# Patient Record
Sex: Female | Born: 2000 | Race: Black or African American | Hispanic: No | Marital: Single
Health system: Midwestern US, Community
[De-identification: ages and names within clinical notes are randomized; demographics above are authoritative.]

## PROBLEM LIST (undated history)

## (undated) DIAGNOSIS — J45909 Unspecified asthma, uncomplicated: Secondary | ICD-10-CM

## (undated) DIAGNOSIS — R6251 Failure to thrive (child): Secondary | ICD-10-CM

## (undated) DIAGNOSIS — J069 Acute upper respiratory infection, unspecified: Secondary | ICD-10-CM

## (undated) HISTORY — PX: INGUINAL HERNIA REPAIR: SUR1180

## (undated) HISTORY — DX: Acute upper respiratory infection, unspecified: J06.9

## (undated) HISTORY — DX: Unspecified asthma, uncomplicated: J45.909

---
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
## 2022-03-21 ENCOUNTER — Emergency Department (HOSPITAL_COMMUNITY)
Admission: EM | Admit: 2022-03-21 | Discharge: 2022-03-21 | Disposition: A | Discharge status: Home / Self Care | Payer: Medicaid Other | Attending: Emergency Medicine | Admitting: Emergency Medicine

## 2022-03-21 ENCOUNTER — Other Ambulatory Visit: Payer: Self-pay

## 2022-03-21 ENCOUNTER — Emergency Department (HOSPITAL_COMMUNITY): Payer: Medicaid Other

## 2022-03-21 ENCOUNTER — Encounter (HOSPITAL_COMMUNITY): Payer: Self-pay

## 2022-03-21 DIAGNOSIS — Y92410 Unspecified street and highway as the place of occurrence of the external cause: Secondary | ICD-10-CM | POA: Insufficient documentation

## 2022-03-21 DIAGNOSIS — S7002XA Contusion of left hip, initial encounter: Secondary | ICD-10-CM | POA: Diagnosis not present

## 2022-03-21 DIAGNOSIS — S79912A Unspecified injury of left hip, initial encounter: Secondary | ICD-10-CM | POA: Diagnosis present

## 2022-03-21 LAB — I-STAT BETA HCG BLOOD, ED (MC, WL, AP ONLY): I-stat hCG, quantitative: <5 m[IU]/mL (ref <5)

## 2022-03-21 MED ORDER — CYCLOBENZAPRINE HCL 10 MG PO TABS: 10.0000 mg | ORAL_TABLET | Freq: Two times a day (BID) | ORAL | 0 refills | Status: DC | PRN

## 2022-03-21 NOTE — ED Notes (Signed)
Provided pt ice pack.
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
## 2022-03-21 NOTE — ED Notes (Signed)
Patient transported to X-ray 
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
## 2022-03-21 NOTE — Discharge Instructions (Signed)
Recommend Tylenol, ibuprofen for pain.  Flexeril is a muscle relaxant.  Please use safely as this medication is sedating.  No dangerous activities or driving. ?

## 2022-04-01 DIAGNOSIS — M25552 Pain in left hip: Secondary | ICD-10-CM | POA: Insufficient documentation

## 2022-04-09 DIAGNOSIS — M5416 Radiculopathy, lumbar region: Secondary | ICD-10-CM | POA: Insufficient documentation

## 2022-04-09 HISTORY — DX: Radiculopathy, lumbar region: M54.16

## 2022-07-24 DIAGNOSIS — M79605 Pain in left leg: Secondary | ICD-10-CM | POA: Insufficient documentation
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
## 2023-06-19 ENCOUNTER — Emergency Department (HOSPITAL_COMMUNITY)
Admission: EM | Admit: 2023-06-19 | Discharge: 2023-06-19 | Disposition: A | Discharge status: Home / Self Care | Payer: Medicaid Other | Attending: Emergency Medicine | Admitting: Emergency Medicine

## 2023-06-19 ENCOUNTER — Other Ambulatory Visit: Payer: Self-pay

## 2023-06-19 ENCOUNTER — Emergency Department (HOSPITAL_COMMUNITY): Payer: Medicaid Other

## 2023-06-19 DIAGNOSIS — R55 Syncope and collapse: Secondary | ICD-10-CM

## 2023-06-19 DIAGNOSIS — Z1152 Encounter for screening for COVID-19: Secondary | ICD-10-CM | POA: Insufficient documentation

## 2023-06-19 LAB — CBC WITH DIFFERENTIAL/PLATELET
Abs Immature Granulocytes: 0.01 10*3/uL (ref 0.00–0.07)
Basophils Absolute: 0 10*3/uL (ref 0.0–0.1)
Basophils Relative: 1 %
Eosinophils Absolute: 0 10*3/uL (ref 0.0–0.5)
Eosinophils Relative: 0 %
HCT: 37 % (ref 36.0–46.0)
Hemoglobin: 11.7 g/dL — ABNORMAL LOW (ref 12.0–15.0)
Immature Granulocytes: 0 %
Lymphocytes Relative: 51 %
Lymphs Abs: 2 10*3/uL (ref 0.7–4.0)
MCH: 27 pg (ref 26.0–34.0)
MCHC: 31.6 g/dL (ref 30.0–36.0)
MCV: 85.3 fL (ref 80.0–100.0)
Monocytes Absolute: 0.4 10*3/uL (ref 0.1–1.0)
Monocytes Relative: 11 %
Neutro Abs: 1.5 10*3/uL — ABNORMAL LOW (ref 1.7–7.7)
Neutrophils Relative %: 37 %
Platelets: 197 10*3/uL (ref 150–400)
RBC: 4.34 MIL/uL (ref 3.87–5.11)
RDW: 12.5 % (ref 11.5–15.5)
WBC: 3.9 10*3/uL — ABNORMAL LOW (ref 4.0–10.5)
nRBC: 0 % (ref 0.0–0.2)

## 2023-06-19 LAB — BASIC METABOLIC PANEL
Anion gap: 12 (ref 5–15)
BUN: 9 mg/dL (ref 6–20)
CO2: 21 mmol/L — ABNORMAL LOW (ref 22–32)
Calcium: 8.9 mg/dL (ref 8.9–10.3)
Chloride: 103 mmol/L (ref 98–111)
Creatinine, Ser: 0.64 mg/dL (ref 0.44–1.00)
GFR, Estimated: >60 mL/min (ref >60)
Glucose, Bld: 88 mg/dL (ref 70–99)
Potassium: 3.5 mmol/L (ref 3.5–5.1)
Sodium: 136 mmol/L (ref 135–145)

## 2023-06-19 LAB — D-DIMER, QUANTITATIVE: D-Dimer, Quant: 0.46 ug/mL-FEU (ref 0.00–0.50)

## 2023-06-19 LAB — HCG, QUANTITATIVE, PREGNANCY: hCG, Beta Chain, Quant, S: <1 m[IU]/mL (ref <5)

## 2023-06-19 LAB — CBG MONITORING, ED: Glucose-Capillary: 74 mg/dL (ref 70–99)

## 2023-06-19 NOTE — ED Provider Notes (Signed)
[place] EMERGENCY DEPARTMENT AT [facility] Provider Note   CSN: [id number] Arrival date & time: 06/19/23  1755     History  Chief Complaint  Patient presents with   Near Syncope    Caroline Castillo is a 22 y.o. female presenting to the ED with concern for lightheadedness.  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  She cannot recall whether she felt sick this morning earlier this week, saying "my memory is just generally bad".  However she began to feel lightheaded with tunnel vision while she was standing and cleaning and was advised to sit down.  She now feels back to normal.  She reports she did not eat or drink anything today.  She denies any chest pain or pressure.  She denies any prior history of syncope.    Her mother is present at the bedside and does report a history of DVT and/or PE in the mother, during pregnancy, and her mother says she (the mother) tested positive for protein S deficiency  HPI     Home Medications Prior to Admission medications   Medication Sig Start Date End Date Taking? Authorizing Provider  Azelastine HCl 0.15 % SOLN Place 2 sprays into both nostrils 2 (two) times daily as needed. 09/04/21   Marcelyn Bruins, MD  beclomethasone (QVAR) 80 MCG/ACT inhaler Inhale 2 puffs into the lungs daily.    [provider]  cyclobenzaprine (FLEXERIL) 10 MG tablet Take 1 tablet (10 mg total) by mouth 2 (two) times daily as needed for muscle spasms. 03/21/22   Curatolo, Adam, DO  fluticasone (FLONASE) 50 MCG/ACT nasal spray Place 2 sprays into both nostrils daily.    [provider]  levocetirizine (XYZAL) 5 MG tablet Take 1 tablet (5 mg total) by mouth every evening. 09/04/21   Marcelyn Bruins, MD  montelukast (SINGULAIR) 10 MG tablet Take 10 mg by mouth at bedtime.    [provider]  Olopatadine HCl 0.2 % SOLN Apply 1 drop to eye daily as needed.  09/04/21   Marcelyn Bruins, MD      Allergies    Patient has no known allergies.    Review of Systems   Review of Systems  Physical Exam Updated Vital Signs BP 106/76   Pulse 84   Temp 99 F (37.2 C)   Resp 16   Ht 5\' 5"  (1.651 m)   Wt 49.9 kg   SpO2 96%   BMI 18.30 kg/m  Physical Exam Constitutional:      General: She is not in acute distress. HENT:     Head: Normocephalic and atraumatic.  Eyes:     Conjunctiva/sclera: Conjunctivae normal.     Pupils: Pupils are equal, round, and reactive to light.  Cardiovascular:     Rate and Rhythm: Normal rate and regular rhythm.  Pulmonary:     Effort: Pulmonary effort is normal. No respiratory distress.     Breath sounds: Normal breath sounds.  Abdominal:     General: There is no distension.     Tenderness: There is no abdominal tenderness.  Skin:    General: Skin is warm and dry.  Neurological:     General: No focal deficit present.     Mental Status: She is alert. Mental status is at baseline.  Psychiatric:        Mood and Affect: Mood normal.        Behavior: Behavior  normal.     ED Results / Procedures / Treatments   Labs (all labs ordered are listed, but only abnormal results are displayed) Labs Reviewed  BASIC METABOLIC PANEL - Abnormal; Notable for the following components:      Result Value   CO2 21 (*)    All other components within normal limits  CBC WITH DIFFERENTIAL/PLATELET - Abnormal; Notable for the following components:   WBC 3.9 (*)    Hemoglobin 11.7 (*)    Neutro Abs 1.5 (*)    All other components within normal limits  SARS CORONAVIRUS 2 BY RT PCR  HCG, QUANTITATIVE, PREGNANCY  D-DIMER, QUANTITATIVE  CBG MONITORING, ED    EKG EKG Interpretation Date/Time:  Friday June 19 2023 17:53:14 EDT Ventricular Rate:  82 PR Interval:  162 QRS Duration:  92 QT Interval:  348 QTC Calculation: 406 R Axis:   99  Text Interpretation: Normal sinus rhythm Rightward axis Borderline ECG No  previous ECGs available Confirmed by Alvester Chou [phone]) on 06/19/2023 10:01:19 PM  Radiology DG Chest Portable 1 View  Result Date: 06/19/2023 CLINICAL DATA:  near syncope EXAM: PORTABLE CHEST 1 VIEW COMPARISON:  Chest x-ray 08/21/2020 FINDINGS: The heart and mediastinal contours are within normal limits. No focal consolidation. No pulmonary edema. No pleural effusion. No pneumothorax. No acute osseous abnormality. IMPRESSION: No active disease. Electronically Signed   By: Tish Frederickson M.D.   On: 06/19/2023 22:28    Procedures Procedures    Medications Ordered in ED Medications - No data to display  ED Course/ Medical Decision Making/ A&P                             Medical Decision Making Amount and/or Complexity of Data Reviewed Labs: ordered. Radiology: ordered.   This patient presents to the ED with concern for near syncope. This involves an extensive number of treatment options, and is a complaint that carries with it a high risk of complications and morbidity.  The differential diagnosis includes vasovagal episode versus arrhythmia versus anemia versus PE versus other  Co-morbidities that complicate the patient evaluation: Family history of coagulopathy at high risk of thrombus  Additional history obtained from patient's mother at bedside  I ordered and personally interpreted labs.  The pertinent results include: No emergent findings  I ordered imaging studies including dg chest I independently visualized and interpreted imaging which showed no emergent findings I agree with the radiologist interpretation  The patient was maintained on a cardiac monitor.  I personally viewed and interpreted the cardiac monitored which showed an underlying rhythm of: Sinus rhythm  Per my interpretation the patient's ECG shows sinus rhythm no acute ischemic findings  I have reviewed the patients home medicines and have made adjustments as needed  Test Considered: With a negative  D-dimer I have a lower suspicion for acute PE do not see that a CT angiogram is indicated  After the interventions noted above, I reevaluated the patient and found that they have: improved -she has been observed in the ER for period of 5 and half hours with no recurring symptoms, feeling back to baseline and well.    Dispostion:  After consideration of the diagnostic results and the patients response to treatment, I feel that the patent would benefit from outpatient PCP follow-up.  I discussed with the patient, as well as her mother at bedside, but this episode today may have been a vasovagal episode, may have  been triggered also by the fact that she did not eat anything all day.  I certainly encouraged her to eat regular meals during the daytime.  I encouraged her to follow-up with her PCP.  Return precautions were discussed.  I advised rest over the weekend.         Final Clinical Impression(s) / ED Diagnoses Final diagnoses:  Near syncope    Rx / DC Orders ED Discharge Orders     None         Terald Sleeper, MD 06/19/23 2330

## 2023-06-19 NOTE — Discharge Instructions (Addendum)
  Please follow up on the results of your Covid test tomorrow morning on your patient portal.

## 2023-06-19 NOTE — ED Triage Notes (Signed)
Pt to ED c/o near syncope. 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 Pt reports not feeling well/generalized weakness x 5 hours, but came to work anyway.

## 2023-06-19 NOTE — ED Provider Triage Note (Signed)
Emergency Medicine Provider Triage Evaluation Note  Caroline Castillo , a 22 y.o. female  was evaluated in triage.  Pt complains of pre-syncopal episode.  Review of Systems  Positive:  Negative:   Physical Exam  BP 117/86 (BP Location: Right Arm)   Pulse 83   Temp 98.6 F (37 C) (Oral)   Resp 14   Ht 5\' 5"  (1.651 m)   Wt 49.9 kg   SpO2 100%   BMI 18.30 kg/m  Gen:   Awake, no distress   Resp:  Normal effort  MSK:   Moves extremities without difficulty  Other:    Medical Decision Making  Medically screening exam initiated at 6:39 PM.  Appropriate orders placed.  Caroline Castillo was informed that the remainder of the evaluation will be completed by another provider, this initial triage assessment does not replace that evaluation, and the importance of remaining in the ED until their evaluation is complete.  Felt fatigued around 5 hours ago while standing and cleaning a room. Endorses palpitations and started losing vision which has since resolved. Endorsing nausea now. These symptoms have happened in the past and patient was not diagnosed with anything at the time. States that she has been craving sodium and sugar a lot.  Denies fever, chest pain, dyspnea, abdominal pain, vomiting, diarrhea. No loss of consciousness or seizures.   Caroline Castillo, [place] 06/19/23 1844

## 2023-06-20 LAB — SARS CORONAVIRUS 2 BY RT PCR: SARS Coronavirus 2 by RT PCR: NEGATIVE

## 2023-06-23 LAB — CBG MONITORING, ED: Glucose-Capillary: 98 mg/dL (ref 70–99)

## 2023-07-09 ENCOUNTER — Encounter: Payer: Self-pay | Admitting: Family Medicine

## 2023-07-09 ENCOUNTER — Ambulatory Visit (INDEPENDENT_AMBULATORY_CARE_PROVIDER_SITE_OTHER): Admitting: Family Medicine

## 2023-07-09 VITALS — BP 104/71 | HR 83 | Temp 97.6°F | Resp 18 | Ht 65.5 in | Wt 107.2 lb

## 2023-07-09 DIAGNOSIS — F4310 Post-traumatic stress disorder, unspecified: Secondary | ICD-10-CM | POA: Diagnosis not present

## 2023-07-09 DIAGNOSIS — F418 Other specified anxiety disorders: Secondary | ICD-10-CM | POA: Diagnosis not present

## 2023-07-09 DIAGNOSIS — Z113 Encounter for screening for infections with a predominantly sexual mode of transmission: Secondary | ICD-10-CM

## 2023-07-09 DIAGNOSIS — Z7689 Persons encountering health services in other specified circumstances: Secondary | ICD-10-CM

## 2023-07-09 DIAGNOSIS — M5416 Radiculopathy, lumbar region: Secondary | ICD-10-CM | POA: Diagnosis not present

## 2023-07-09 DIAGNOSIS — J4520 Mild intermittent asthma, uncomplicated: Secondary | ICD-10-CM

## 2023-07-09 HISTORY — DX: Other specified anxiety disorders: F41.8

## 2023-07-09 HISTORY — DX: Post-traumatic stress disorder, unspecified: F43.10

## 2023-07-09 HISTORY — DX: Mild intermittent asthma, uncomplicated: J45.20
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
## 2023-07-21 ENCOUNTER — Encounter: Payer: Self-pay | Admitting: Family Medicine

## 2023-07-22 ENCOUNTER — Encounter: Payer: Self-pay | Admitting: Family Medicine

## 2023-10-15 ENCOUNTER — Encounter: Payer: Self-pay | Admitting: Family Medicine

## 2023-10-15 ENCOUNTER — Ambulatory Visit (INDEPENDENT_AMBULATORY_CARE_PROVIDER_SITE_OTHER): Admitting: Family Medicine

## 2023-10-15 VITALS — BP 112/77 | HR 87 | Temp 97.8°F | Resp 18 | Ht 65.5 in | Wt 105.3 lb

## 2023-10-15 DIAGNOSIS — Z3009 Encounter for other general counseling and advice on contraception: Secondary | ICD-10-CM

## 2023-10-15 DIAGNOSIS — Z0000 Encounter for general adult medical examination without abnormal findings: Secondary | ICD-10-CM | POA: Diagnosis not present

## 2023-10-15 DIAGNOSIS — R7302 Impaired glucose tolerance (oral): Secondary | ICD-10-CM

## 2023-10-15 DIAGNOSIS — Z23 Encounter for immunization: Secondary | ICD-10-CM | POA: Diagnosis not present

## 2023-10-15 DIAGNOSIS — Z136 Encounter for screening for cardiovascular disorders: Secondary | ICD-10-CM

## 2023-10-15 DIAGNOSIS — Z13220 Encounter for screening for lipoid disorders: Secondary | ICD-10-CM

## 2023-10-15 MED ORDER — NORGESTIM-ETH ESTRAD TRIPHASIC 0.18/0.215/0.25 MG-25 MCG PO TABS
1.0000 | ORAL_TABLET | Freq: Every day | ORAL | 11 refills | Status: DC
Start: 2023-10-15 — End: 2024-09-20

## 2023-10-15 NOTE — Progress Notes (Signed)
Complete physical exam  Patient: Caroline Castillo   DOB: [birth year]   22 y.o. Female  MRN: [id number]  Subjective:    Chief Complaint  Patient presents with   Annual Exam    Patient is here for her annual physical ,     Caroline Castillo is a 22 y.o. female who presents today for a complete physical exam. She reports consuming a general diet.  Goes to the gym once a week. Started today.  She generally feels well. She reports sleeping well. She does have additional problems to discuss today.   She is interested in birth control. She is sexually active with her boyfriend. LMP October 23.  Pt would like flu vaccine.  Most recent fall risk assessment:    07/09/2023   10:58 AM  Fall Risk   Falls in the past year? 0  Number falls in past yr: 0  Injury with Fall? 0  Risk for fall due to : No Fall Risks  Follow up Falls evaluation completed     Most recent depression screenings:    07/09/2023   10:58 AM  PHQ 2/9 Scores  PHQ - 2 Score 0  PHQ- 9 Score 4    Vision:Within last year  Patient Active Problem List   Diagnosis Date Noted   PTSD (post-traumatic stress disorder) 07/09/2023   Depression with anxiety 07/09/2023   Mild intermittent asthma without complication 07/09/2023   Pain of left lower extremity 07/24/2022   Lumbar radiculopathy 04/09/2022   Pain of left hip joint 04/01/2022   Past Medical History:  Diagnosis Date   Depression with anxiety 07/09/2023   Lumbar radiculopathy 04/09/2022   Mild intermittent asthma without complication 07/09/2023   PTSD (post-traumatic stress disorder) 07/09/2023   Past Surgical History:  Procedure Laterality Date   INGUINAL HERNIA REPAIR     Social History   Tobacco Use   Smoking status: Never    Passive exposure: Never   Smokeless tobacco: Never  Vaping Use   Vaping status: Never Used  Substance Use Topics   Alcohol use: Yes    Comment: socially   Drug use: Never   Family Status  Relation Name Status   Mother  Alive    Father  Alive   Sister  Alive   Brother  Alive   H Sister  Alive   H Sister  Alive   H Sister  Alive  No partnership data on file   Family History  Problem Relation Age of Onset   Hypertension Mother    Other Mother        enlarged heart   Allergies  Allergen Reactions   Trazodone And Nefazodone Other (See Comments)    Nightmares      Patient Care Team: Suzan Slick, MD as PCP - General (Family Medicine)   Outpatient Medications Prior to Visit  Medication Sig   beclomethasone (QVAR) 80 MCG/ACT inhaler Inhale 2 puffs into the lungs daily.   escitalopram (LEXAPRO) 10 MG tablet Take 10 mg by mouth daily.   fluticasone (FLONASE) 50 MCG/ACT nasal spray Place 2 sprays into both nostrils daily.   gabapentin (NEURONTIN) 100 MG capsule Take 100 mg by mouth 3 (three) times daily.   levocetirizine (XYZAL) 5 MG tablet Take 1 tablet (5 mg total) by mouth every evening.   mirtazapine (REMERON) 7.5 MG tablet Take 7.5 mg by mouth at bedtime.   montelukast (SINGULAIR) 10 MG tablet Take 10 mg by mouth at bedtime.   No facility-administered  medications prior to visit.    Review of Systems  All other systems reviewed and are negative.         Objective:     BP 112/77   Pulse 87   Temp 97.8 F (36.6 C) (Oral)   Resp 18   Ht 5' 5.5" (1.664 m)   Wt 105 lb 4.8 oz (47.8 kg)   SpO2 98%   BMI 17.26 kg/m  BP Readings from Last 3 Encounters:  10/15/23 112/77  07/09/23 104/71  06/19/23 106/76      Physical Exam Vitals and nursing note reviewed.  Constitutional:      Appearance: Normal appearance. She is normal weight.  HENT:     Head: Normocephalic and atraumatic.     Right Ear: Tympanic membrane, ear canal and external ear normal.     Left Ear: Tympanic membrane, ear canal and external ear normal.     Nose: Nose normal.     Mouth/Throat:     Mouth: Mucous membranes are moist.     Pharynx: Oropharynx is clear.  Eyes:     Conjunctiva/sclera: Conjunctivae normal.      Pupils: Pupils are equal, round, and reactive to light.  Cardiovascular:     Rate and Rhythm: Normal rate and regular rhythm.     Pulses: Normal pulses.     Heart sounds: Normal heart sounds.  Pulmonary:     Effort: Pulmonary effort is normal.     Breath sounds: Normal breath sounds.  Abdominal:     General: Abdomen is flat. Bowel sounds are normal.  Skin:    General: Skin is warm.     Capillary Refill: Capillary refill takes less than 2 seconds.  Neurological:     General: No focal deficit present.     Mental Status: She is alert and oriented to person, place, and time. Mental status is at baseline.  Psychiatric:        Mood and Affect: Mood normal.        Behavior: Behavior normal.        Thought Content: Thought content normal.        Judgment: Judgment normal.      No results found for any visits on 10/15/23.      Assessment & Plan:    Routine Health Maintenance and Physical Exam  Immunization History  Administered Date(s) Administered   Influenza, Seasonal, Injecte, Preservative Fre 10/15/2023    Health Maintenance  Topic Date Due   HPV VACCINES (1 - 3-dose series) Never done   DTaP/Tdap/Td (1 - Tdap) Never done   COVID-19 Vaccine (1 - 2023-24 season) Never done   CHLAMYDIA SCREENING  07/08/2024   INFLUENZA VACCINE  Completed   Hepatitis C Screening  Completed   HIV Screening  Completed    Discussed health benefits of physical activity, and encouraged her to engage in regular exercise appropriate for her age and condition.  Problem List Items Addressed This Visit   None Visit Diagnoses     Annual physical exam    -  Primary   Encounter for lipid screening for cardiovascular disease       Relevant Orders   Lipid panel   Impaired glucose tolerance       Relevant Orders   CBC with Differential/Platelet   Comprehensive metabolic panel   Hemoglobin A1c   Family planning       Relevant Medications   Norgestim-Eth Estrad Triphasic (NORGESTIMATE-ETHINYL  ESTRADIOL TRIPHASIC) 0.18/0.215/0.25 MG-25 MCG tab  Need for influenza vaccination       Relevant Orders   Flu vaccine trivalent PF, 6mos and older(Flulaval,Afluria,Fluarix,Fluzone) (Completed)      Return in about 4 weeks (around 11/12/2023) for [facility]. Annual physical exam  Encounter for lipid screening for cardiovascular disease -     Lipid panel  Impaired glucose tolerance -     CBC with Differential/Platelet -     Comprehensive metabolic panel -     Hemoglobin A1c  Family planning -     Norgestim-Eth Estrad Triphasic; Take 1 tablet by mouth daily. To start the Sunday following your next period  Dispense: 28 tablet; Refill: 11  Need for influenza vaccination -     Flu vaccine trivalent PF, 6mos and older(Flulaval,Afluria,Fluarix,Fluzone)   Screening labs Birth control pills started and sent in Follow up in 4 weeks Flu vaccine today.    Suzan Slick, MD

## 2023-10-16 ENCOUNTER — Encounter: Payer: Self-pay | Admitting: Family Medicine

## 2023-10-16 LAB — HEMOGLOBIN A1C
Est. average glucose Bld gHb Est-mCnc: 114 mg/dL
Hgb A1c MFr Bld: 5.6 % (ref 4.8–5.6)

## 2023-10-16 LAB — CBC WITH DIFFERENTIAL/PLATELET
Basophils Absolute: 0 10*3/uL (ref 0.0–0.2)
Basos: 0 %
EOS (ABSOLUTE): 0 10*3/uL (ref 0.0–0.4)
Eos: 0 %
Hematocrit: 41.2 % (ref 34.0–46.6)
Hemoglobin: 12.3 g/dL (ref 11.1–15.9)
Immature Grans (Abs): 0 10*3/uL (ref 0.0–0.1)
Immature Granulocytes: 0 %
Lymphocytes Absolute: 1.5 10*3/uL (ref 0.7–3.1)
Lymphs: 48 %
MCH: 26 pg — ABNORMAL LOW (ref 26.6–33.0)
MCHC: 29.9 g/dL — ABNORMAL LOW (ref 31.5–35.7)
MCV: 87 fL (ref 79–97)
Monocytes Absolute: 0.4 10*3/uL (ref 0.1–0.9)
Monocytes: 14 %
Neutrophils Absolute: 1.2 10*3/uL — ABNORMAL LOW (ref 1.4–7.0)
Neutrophils: 38 %
Platelets: 221 10*3/uL (ref 150–450)
RBC: 4.73 x10E6/uL (ref 3.77–5.28)
RDW: 12.2 % (ref 11.7–15.4)
WBC: 3.2 10*3/uL — ABNORMAL LOW (ref 3.4–10.8)

## 2023-10-16 LAB — COMPREHENSIVE METABOLIC PANEL
ALT: 10 [IU]/L (ref 0–32)
AST: 16 [IU]/L (ref 0–40)
Albumin: 4.2 g/dL (ref 4.0–5.0)
Alkaline Phosphatase: 49 [IU]/L (ref 42–106)
BUN/Creatinine Ratio: 15 (ref 9–23)
BUN: 10 mg/dL (ref 6–20)
Bilirubin Total: 0.7 mg/dL (ref 0.0–1.2)
CO2: 18 mmol/L — ABNORMAL LOW (ref 20–29)
Calcium: 8.8 mg/dL (ref 8.7–10.2)
Chloride: 106 mmol/L (ref 96–106)
Creatinine, Ser: 0.66 mg/dL (ref 0.57–1.00)
Globulin, Total: 3.2 g/dL (ref 1.5–4.5)
Glucose: 88 mg/dL (ref 70–99)
Potassium: 4.2 mmol/L (ref 3.5–5.2)
Sodium: 138 mmol/L (ref 134–144)
Total Protein: 7.4 g/dL (ref 6.0–8.5)
eGFR: 129 mL/min/{1.73_m2} (ref >59)

## 2023-10-16 LAB — LIPID PANEL
Chol/HDL Ratio: 2.7 ratio (ref 0.0–4.4)
Cholesterol, Total: 136 mg/dL (ref 100–199)
HDL: 51 mg/dL (ref >39)
LDL Chol Calc (NIH): 74 mg/dL (ref 0–99)
Triglycerides: 47 mg/dL (ref 0–149)
VLDL Cholesterol Cal: 11 mg/dL (ref 5–40)

## 2023-11-12 ENCOUNTER — Ambulatory Visit: Admitting: Family Medicine

## 2023-11-12 ENCOUNTER — Encounter: Payer: Self-pay | Admitting: Family Medicine

## 2023-11-12 VITALS — BP 111/74 | HR 60 | Temp 97.7°F | Resp 16 | Ht 66.6 in | Wt 114.0 lb

## 2023-11-12 DIAGNOSIS — Z3009 Encounter for other general counseling and advice on contraception: Secondary | ICD-10-CM

## 2023-11-12 NOTE — Progress Notes (Signed)
   Established Patient Office Visit  Subjective   Patient ID: Caroline Castillo, female    DOB: 2001-03-04  Age: 22 y.o. MRN: [id number]  Chief Complaint  Patient presents with   Medical Management of Chronic Issues    Family planning     HPI  Family planning Pt was started on birth control pills a month ago. Doing well with pills and taking them the same time every day.  Pt has noticed weight gain with the pills and has gained 9lbs. She reports her period started on Nov 23 Saturday but she started the pill pack on Dec 1, the following Sunday. She skipped the Sunday she was suppose to have started her OCPs. She is yet to have a period.    Review of Systems  All other systems reviewed and are negative.    Objective:     BP 111/74   Pulse 60   Temp 97.7 F (36.5 C) (Oral)   Resp 16   Ht 5' 6.6" (1.692 m)   Wt 114 lb (51.7 kg)   SpO2 99%   BMI 18.07 kg/m  BP Readings from Last 3 Encounters:  11/12/23 111/74  10/15/23 112/77  07/09/23 104/71      Physical Exam Vitals and nursing note reviewed.  Constitutional:      Appearance: Normal appearance. She is normal weight.  HENT:     Head: Normocephalic and atraumatic.     Right Ear: External ear normal.     Left Ear: External ear normal.     Nose: Nose normal.     Mouth/Throat:     Mouth: Mucous membranes are moist.     Pharynx: Oropharynx is clear.  Eyes:     Conjunctiva/sclera: Conjunctivae normal.     Pupils: Pupils are equal, round, and reactive to light.  Cardiovascular:     Rate and Rhythm: Normal rate.  Pulmonary:     Effort: Pulmonary effort is normal.  Skin:    General: Skin is warm.     Capillary Refill: Capillary refill takes less than 2 seconds.  Neurological:     General: No focal deficit present.     Mental Status: She is alert and oriented to person, place, and time. Mental status is at baseline.  Psychiatric:        Mood and Affect: Mood normal.        Behavior: Behavior normal.        Thought  Content: Thought content normal.        Judgment: Judgment normal.    No results found for any visits on 11/12/23.     The ASCVD Risk score (Arnett DK, et al., 2019) failed to calculate for the following reasons:   The 2019 ASCVD risk score is only valid for ages 73 to 58    Assessment & Plan:   Problem List Items Addressed This Visit   None Family planning   Advised pt to continue using protection while on birth control. Take the same time daily. To follow up in 6 weeks.   No follow-ups on file.    Suzan Slick, MD

## 2023-12-24 ENCOUNTER — Ambulatory Visit: Admitting: Family Medicine

## 2023-12-29 ENCOUNTER — Ambulatory Visit (INDEPENDENT_AMBULATORY_CARE_PROVIDER_SITE_OTHER): Admitting: Family Medicine

## 2023-12-29 ENCOUNTER — Encounter: Payer: Self-pay | Admitting: Family Medicine

## 2023-12-29 VITALS — BP 118/79 | HR 110 | Temp 98.4°F | Resp 18 | Ht 66.6 in | Wt 117.3 lb

## 2023-12-29 DIAGNOSIS — Z20828 Contact with and (suspected) exposure to other viral communicable diseases: Secondary | ICD-10-CM | POA: Diagnosis not present

## 2023-12-29 DIAGNOSIS — Z3009 Encounter for other general counseling and advice on contraception: Secondary | ICD-10-CM | POA: Diagnosis not present

## 2023-12-29 NOTE — Progress Notes (Signed)
   Established Patient Office Visit  Subjective   Patient ID: Caroline Castillo, female    DOB: 2000-12-28  Age: 23 y.o. MRN: [id number]  Chief Complaint  Patient presents with   Contraception    Patient is here to discuss getting birth control pills    HPI  Family planning Pt taking birth control pills the same time everyday. LMP 1/22 and comes on the last week of her pill pack. She has less blood flow with her cycle along with less cramping. She is pleased with her birth control pills.  She has been exposed to mono and would like to get screened today.  Review of Systems  All other systems reviewed and are negative.    Objective:     BP 118/79   Pulse (!) 110   Temp 98.4 F (36.9 C) (Oral)   Resp 18   Ht 5' 6.6" (1.692 m)   Wt 117 lb 4.8 oz (53.2 kg)   SpO2 100%   BMI 18.59 kg/m    Physical Exam Vitals and nursing note reviewed.  Constitutional:      Appearance: Normal appearance. She is normal weight.  HENT:     Head: Normocephalic and atraumatic.     Right Ear: External ear normal.     Left Ear: External ear normal.     Nose: Nose normal.     Mouth/Throat:     Mouth: Mucous membranes are moist.     Pharynx: Oropharynx is clear.  Eyes:     Conjunctiva/sclera: Conjunctivae normal.     Pupils: Pupils are equal, round, and reactive to light.  Cardiovascular:     Rate and Rhythm: Normal rate.  Pulmonary:     Effort: Pulmonary effort is normal.  Skin:    General: Skin is warm.     Capillary Refill: Capillary refill takes less than 2 seconds.  Neurological:     General: No focal deficit present.     Mental Status: She is alert and oriented to person, place, and time. Mental status is at baseline.  Psychiatric:        Mood and Affect: Mood normal.        Behavior: Behavior normal.        Thought Content: Thought content normal.        Judgment: Judgment normal.    No results found for any visits on 12/29/23.    The ASCVD Risk score (Arnett DK, et al.,  2019) failed to calculate for the following reasons:   The 2019 ASCVD risk score is only valid for ages 39 to 33    Assessment & Plan:   Problem List Items Addressed This Visit   None Mono exposure -     Mononucleosis screen  Family planning   To do mono screening today. Continue taking birth control and advised on continuing to use safe sex practices.  No follow-ups on file.    Suzan Slick, MD

## 2023-12-30 LAB — MONONUCLEOSIS SCREEN: Mono Screen: NEGATIVE

## 2024-04-04 ENCOUNTER — Encounter (HOSPITAL_BASED_OUTPATIENT_CLINIC_OR_DEPARTMENT_OTHER): Payer: Self-pay

## 2024-04-04 ENCOUNTER — Emergency Department (HOSPITAL_BASED_OUTPATIENT_CLINIC_OR_DEPARTMENT_OTHER)

## 2024-04-04 ENCOUNTER — Other Ambulatory Visit: Payer: Self-pay

## 2024-04-04 ENCOUNTER — Emergency Department (HOSPITAL_BASED_OUTPATIENT_CLINIC_OR_DEPARTMENT_OTHER): Admission: EM | Admit: 2024-04-04 | Discharge: 2024-04-04 | Disposition: A | Discharge status: Home / Self Care

## 2024-04-04 DIAGNOSIS — X503XXA Overexertion from repetitive movements, initial encounter: Secondary | ICD-10-CM | POA: Insufficient documentation

## 2024-04-04 DIAGNOSIS — S8991XA Unspecified injury of right lower leg, initial encounter: Secondary | ICD-10-CM | POA: Diagnosis present

## 2024-04-04 DIAGNOSIS — S8001XA Contusion of right knee, initial encounter: Secondary | ICD-10-CM | POA: Insufficient documentation

## 2024-04-04 DIAGNOSIS — Q682 Congenital deformity of knee: Secondary | ICD-10-CM

## 2024-04-04 MED ORDER — MELOXICAM 15 MG PO TABS: 15.0000 mg | ORAL_TABLET | Freq: Every day | ORAL | 0 refills | Status: DC

## 2024-04-04 NOTE — ED Notes (Signed)
 Discharge paperwork given and verbally understood.

## 2024-04-04 NOTE — ED Provider Notes (Signed)
 [place] EMERGENCY DEPARTMENT AT [facility] Provider Note   CSN: [id number] Arrival date & time: 04/04/24  0272     History  Chief Complaint  Patient presents with   Knee Pain    Caroline Castillo is a 23 y.o. female who presents emergency department chief complaint of right knee pain.  She denies any specific injury but does state that she was moving out of her campus apartment yesterday and went up and down the stairs a lot.  She reports that since that time she has pain in her knee Especially inferior to the knee.  She does not have any specific feelings of instability in the knee joint but states she has chronic sensation of instability in the knee She has no history of full dislocation but states that she has had it subluxate in the past.  She is able to ambulate with pain.   Knee Pain      Home Medications Prior to Admission medications   Medication Sig Start Date End Date Taking? Authorizing Provider  atomoxetine (STRATTERA) 40 MG capsule Take 40 mg by mouth every morning. 12/10/23   [provider]  beclomethasone (QVAR) 80 MCG/ACT inhaler Inhale 2 puffs into the lungs daily.    [provider]  escitalopram (LEXAPRO) 10 MG tablet Take 10 mg by mouth daily.    [provider]  fluticasone (FLONASE) 50 MCG/ACT nasal spray Place 2 sprays into both nostrils daily.    [provider]  gabapentin (NEURONTIN) 100 MG capsule Take 100 mg by mouth 3 (three) times daily.    [provider]  levocetirizine (XYZAL ) 5 MG tablet Take 1 tablet (5 mg total) by mouth every evening. 09/04/21   Brian Campanile, MD  mirtazapine (REMERON) 7.5 MG tablet Take 7.5 mg by mouth at bedtime. 04/22/23   [provider]  montelukast (SINGULAIR) 10 MG tablet Take 10 mg by mouth at bedtime.    [provider]  Thresa Floor Triphasic (NORGESTIMATE-ETHINYL ESTRADIOL TRIPHASIC) 0.18/0.215/0.25 MG-25 MCG tab Take 1 tablet by  mouth daily. To start the Sunday following your next period 10/15/23   Manette Section, MD      Allergies    Trazodone and nefazodone    Review of Systems   Review of Systems  Physical Exam Updated Vital Signs BP 122/77 (BP Location: Right Arm)   Pulse (!) 101   Temp 98 F (36.7 C) (Oral)   Resp 18   Ht 5\' 5"  (1.651 m)   Wt 54.4 kg   LMP 03/16/2024 (Approximate) Comment: Birth control  SpO2 100%   BMI 19.97 kg/m  Physical Exam Vitals and nursing note reviewed.  Constitutional:      General: She is not in acute distress.    Appearance: She is well-developed. She is not diaphoretic.  HENT:     Head: Normocephalic and atraumatic.     Right Ear: External ear normal.     Left Ear: External ear normal.     Nose: Nose normal.     Mouth/Throat:     Mouth: Mucous membranes are moist.  Eyes:     General: No scleral icterus.    Conjunctiva/sclera: Conjunctivae normal.  Cardiovascular:     Rate and Rhythm: Normal rate and regular rhythm.     Heart sounds: Normal heart sounds. No murmur heard.    No friction rub. No gallop.  Pulmonary:     Effort: Pulmonary effort is normal. No respiratory distress.  Breath sounds: Normal breath sounds.  Abdominal:     General: Bowel sounds are normal. There is no distension.     Palpations: Abdomen is soft. There is no mass.     Tenderness: There is no abdominal tenderness. There is no guarding.  Musculoskeletal:     Cervical back: Normal range of motion.       Legs:     Comments: Pain and minimal swelling noted to the inferolateral area of the patella.  Full active and passive range of motion with tenderness in the area specified. bruising and or ligamentous instability of the knee joint noted.  Skin:    General: Skin is warm and dry.  Neurological:     Mental Status: She is alert and oriented to person, place, and time.  Psychiatric:        Behavior: Behavior normal.     ED Results / Procedures / Treatments   Labs (all labs  ordered are listed, but only abnormal results are displayed) Labs Reviewed - No data to display  EKG None  Radiology DG Knee Complete 4 Views Right Result Date: 04/04/2024 CLINICAL DATA:  Knee pain EXAM: RIGHT KNEE - COMPLETE 4+ VIEW COMPARISON:  None Available. FINDINGS: No evidence of fracture, dislocation, or joint effusion. No evidence of arthropathy or other focal bone abnormality. Soft tissues are unremarkable. Patella Alta IMPRESSION: Patella Alta.  No fractures or bony abnormalities Electronically Signed   By: Fredrich Jefferson M.D.   On: 04/04/2024 10:37    Procedures Procedures    Medications Ordered in ED Medications - No data to display  ED Course/ Medical Decision Making/ A&P                                 Medical Decision Making Amount and/or Complexity of Data Reviewed Radiology: ordered.   Patient here with right knee pain after moving and lifting furniture going up and down stairs yesterday.  I have visualized and interpreted a right knee x-ray.  Patient has a high riding patella and this is confirmed on x-ray agree with radiologist interpretation of patella alta.  This also fits the patient's symptoms of chronic feelings of knee instability and she is likely irritated the patellar complex with all of her activity yesterday.  Plan is to brace provide crutches for relief RICE therapy and outpatient follow-up with orthopedics.  She does not have evidence of any emergent cause of her symptoms such as fracture, quadricep tendon rupture, septic joint.  Discussed outpatient follow-up and return precautions.        Final Clinical Impression(s) / ED Diagnoses Final diagnoses:  None    Rx / DC Orders ED Discharge Orders     None         Tama Fails, PA-C 04/04/24 1056    Rolinda Climes, [place] 04/04/24 [phone]

## 2024-04-04 NOTE — Discharge Instructions (Addendum)
 Get help right away if: You cannot use your injured knee to support any of your body weight (cannot bear weight). You cannot move the injured joint. You cannot walk more than a few steps without pain or without your knee buckling. You have a lot of pain, swelling, or numbness in the leg below the cast, brace, or splint. Your foot or toes are numb, cold, or blue after you loosen your splint or brace.

## 2024-04-04 NOTE — ED Triage Notes (Signed)
 In for eval of right knee pain onset 2 days ago with swelling. Denies known injury.

## 2024-06-27 ENCOUNTER — Ambulatory Visit: Admitting: Family Medicine

## 2024-07-07 ENCOUNTER — Telehealth: Payer: Self-pay

## 2024-07-07 NOTE — Telephone Encounter (Signed)
 Spoke with patient and informed her that Medical records could not be emailed, Provided patient with Medical records number   Copied from CRM [id number]. Topic: Medical Record Request - Records Request >> Jul 07, 2024 11:39 AM Caroline Castillo wrote: Reason for CRM: Patient is requesting her medical records to be sent to her via email at [email or web address]? The patient can be contacted at [phone]

## 2024-07-11 ENCOUNTER — Ambulatory Visit
Admission: EM | Admit: 2024-07-11 | Discharge: 2024-07-11 | Disposition: A | Discharge status: Home / Self Care | Source: Ambulatory Visit | Attending: Family Medicine | Admitting: Family Medicine

## 2024-07-11 DIAGNOSIS — J4520 Mild intermittent asthma, uncomplicated: Secondary | ICD-10-CM

## 2024-07-11 DIAGNOSIS — R0602 Shortness of breath: Secondary | ICD-10-CM | POA: Diagnosis not present

## 2024-07-11 DIAGNOSIS — R002 Palpitations: Secondary | ICD-10-CM

## 2024-07-11 MED ORDER — ALBUTEROL SULFATE HFA 108 (90 BASE) MCG/ACT IN AERS: 1.0000 | INHALATION_SPRAY | Freq: Four times a day (QID) | RESPIRATORY_TRACT | 0 refills | Status: AC | PRN
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
## 2024-07-11 NOTE — ED Triage Notes (Signed)
 Pt states she woke this am with dizziness and palpitations-denies pain-states she did go to work but had to leave-no meds PTA-NAD-slow steady gait

## 2024-08-14 ENCOUNTER — Other Ambulatory Visit: Payer: Self-pay

## 2024-08-14 ENCOUNTER — Encounter (HOSPITAL_COMMUNITY): Payer: Self-pay

## 2024-08-14 ENCOUNTER — Emergency Department (HOSPITAL_COMMUNITY)
Admission: EM | Admit: 2024-08-14 | Discharge: 2024-08-15 | Discharge status: Against Medical Advice | Attending: Emergency Medicine | Admitting: Emergency Medicine

## 2024-08-14 DIAGNOSIS — L601 Onycholysis: Secondary | ICD-10-CM | POA: Diagnosis present

## 2024-08-14 DIAGNOSIS — Z5321 Procedure and treatment not carried out due to patient leaving prior to being seen by health care provider: Secondary | ICD-10-CM | POA: Insufficient documentation

## 2024-08-14 NOTE — ED Triage Notes (Signed)
 She was skating and bend her nail back. She has a wrap around it but she says that it is completely detached from the nail bed. Bleeding controlled.

## 2024-08-15 ENCOUNTER — Ambulatory Visit: Payer: Self-pay | Admitting: Nurse Practitioner

## 2024-08-15 ENCOUNTER — Other Ambulatory Visit: Payer: Self-pay

## 2024-08-15 ENCOUNTER — Ambulatory Visit (INDEPENDENT_AMBULATORY_CARE_PROVIDER_SITE_OTHER)

## 2024-08-15 ENCOUNTER — Ambulatory Visit
Admission: EM | Admit: 2024-08-15 | Discharge: 2024-08-15 | Disposition: A | Discharge status: Home / Self Care | Attending: Family Medicine | Admitting: Family Medicine

## 2024-08-15 DIAGNOSIS — M79645 Pain in left finger(s): Secondary | ICD-10-CM

## 2024-08-15 NOTE — Discharge Instructions (Signed)
 Your x-ray was negative for fracture.  You may use the finger splint for comfort/secure your nail.  You may lose the nail due to the nature of the injury.  May use over-the-counter Tylenol or ibuprofen  as needed.  Please follow-up with your PCP in 1 week for recheck.  Please go to the ER for any worsening symptoms.  Hope you feel better soon!

## 2024-08-15 NOTE — ED Provider Notes (Signed)
 [facility]    CSN: [id number] Arrival date & time: 08/15/24  1247      History   Chief Complaint No chief complaint on file.   HPI Caroline Castillo is a 23 y.o. female presents for thumb injury.  Patient reports yesterday she was rollerskating when she fell injuring her left thumb.  She has acrylic nails on and does state the nail lifted all the way back and she had some bleeding.  She endorses intermittent pain but denies numbness tingling bruising or swelling.  No history of injuries or surgeries to the thumb or nail in the past.  No OTC treatments have been used.  No other concerns at this time  HPI  Past Medical History:  Diagnosis Date   Depression with anxiety 07/09/2023   Lumbar radiculopathy 04/09/2022   Mild intermittent asthma without complication 07/09/2023   PTSD (post-traumatic stress disorder) 07/09/2023    Patient Active Problem List   Diagnosis Date Noted   PTSD (post-traumatic stress disorder) 07/09/2023   Depression with anxiety 07/09/2023   Mild intermittent asthma without complication 07/09/2023   Pain of left lower extremity 07/24/2022   Lumbar radiculopathy 04/09/2022   Pain of left hip joint 04/01/2022    Past Surgical History:  Procedure Laterality Date   INGUINAL HERNIA REPAIR      OB History   No obstetric history on file.      Home Medications    Prior to Admission medications   Medication Sig Start Date End Date Taking? Authorizing Provider  albuterol  (VENTOLIN  HFA) 108 (90 Base) MCG/ACT inhaler Inhale 1-2 puffs into the lungs every 6 (six) hours as needed for wheezing or shortness of breath. 07/11/24   Christopher Savannah, PA-C  atomoxetine (STRATTERA) 40 MG capsule Take 40 mg by mouth every morning. 12/10/23   [provider]  beclomethasone (QVAR) 80 MCG/ACT inhaler Inhale 2 puffs into the lungs daily.    [provider]  escitalopram (LEXAPRO) 10 MG tablet Take 10 mg by mouth daily.    [provider]   fluticasone (FLONASE) 50 MCG/ACT nasal spray Place 2 sprays into both nostrils daily.    [provider]  gabapentin (NEURONTIN) 100 MG capsule Take 100 mg by mouth 3 (three) times daily.    [provider]  levocetirizine (XYZAL ) 5 MG tablet Take 1 tablet (5 mg total) by mouth every evening. 09/04/21   Jeneal Danita Macintosh, MD  meloxicam  (MOBIC ) 15 MG tablet Take 1 tablet (15 mg total) by mouth daily. 04/04/24   Harris, Abigail, PA-C  mirtazapine (REMERON) 7.5 MG tablet Take 7.5 mg by mouth at bedtime. 04/22/23   [provider]  montelukast (SINGULAIR) 10 MG tablet Take 10 mg by mouth at bedtime.    [provider]  Jaquita Kale Triphasic (NORGESTIMATE-ETHINYL ESTRADIOL TRIPHASIC) 0.18/0.215/0.25 MG-25 MCG tab Take 1 tablet by mouth daily. To start the Sunday following your next period 10/15/23   Colette Torrence GRADE, MD    Family History Family History  Problem Relation Age of Onset   Hypertension Mother    Other Mother        enlarged heart    Social History Social History   Tobacco Use   Smoking status: Never    Passive exposure: Never   Smokeless tobacco: Never  Vaping Use   Vaping status: Never Used  Substance Use Topics   Alcohol use: Yes    Comment: occ   Drug use: Never     Allergies  Trazodone and nefazodone   Review of Systems Review of Systems  Musculoskeletal:        Left thumb/nail injury     Physical Exam Triage Vital Signs ED Triage Vitals  Encounter Vitals Group     BP 08/15/24 1312 115/79     Girls Systolic BP Percentile --      Girls Diastolic BP Percentile --      Boys Systolic BP Percentile --      Boys Diastolic BP Percentile --      Pulse Rate 08/15/24 1312 85     Resp 08/15/24 1312 16     Temp 08/15/24 1312 97.9 F (36.6 C)     Temp Source 08/15/24 1312 Oral     SpO2 08/15/24 1312 97 %     Weight --      Height --      Head Circumference --      Peak Flow --      Pain Score 08/15/24  1310 8     Pain Loc --      Pain Education --      Exclude from Growth Chart --    No data found.  Updated Vital Signs BP 115/79   Pulse 85   Temp 97.9 F (36.6 C) (Oral)   Resp 16   LMP 08/02/2024 (Approximate)   SpO2 97%   Visual Acuity Right Eye Distance:   Left Eye Distance:   Bilateral Distance:    Right Eye Near:   Left Eye Near:    Bilateral Near:     Physical Exam Vitals and nursing note reviewed.  Constitutional:      General: She is not in acute distress.    Appearance: Normal appearance. She is not ill-appearing.  HENT:     Head: Normocephalic and atraumatic.  Eyes:     Pupils: Pupils are equal, round, and reactive to light.  Cardiovascular:     Rate and Rhythm: Normal rate.  Pulmonary:     Effort: Pulmonary effort is normal.  Musculoskeletal:     Comments: There is mild swelling in the left thumb without erythema.  Tender to palpation to the distal thumb.  Acrylic nail slightly left but otherwise is adherent.  Nail root is not exposed.  There is some dried blood at the medial aspect of the nail but due to the acrylic unable to see if there is a subungual.  Cap refill is +2.  Skin:    General: Skin is warm and dry.  Neurological:     General: No focal deficit present.     Mental Status: She is alert and oriented to person, place, and time.  Psychiatric:        Mood and Affect: Mood normal.        Behavior: Behavior normal.      UC Treatments / Results  Labs (all labs ordered are listed, but only abnormal results are displayed) Labs Reviewed - No data to display  EKG   Radiology DG Finger Thumb Left Result Date: 08/15/2024 CLINICAL DATA:  Fall on some yesterday, nail injury. EXAM: LEFT THUMB 2+V COMPARISON:  None Available. FINDINGS: There is no evidence of fracture or dislocation. There is no evidence of arthropathy or other focal bone abnormality. Soft tissues are unremarkable. IMPRESSION: Negative. Electronically Signed   By: Ryan Salvage M.D.   On: 08/15/2024 13:34    Procedures Procedures (including critical care time)  Medications Ordered in UC Medications - No data to  display  Initial Impression / Assessment and Plan / UC Course  I have reviewed the triage vital signs and the nursing notes.  Pertinent labs & imaging results that were available during my care of the patient were reviewed by me and considered in my medical decision making (see chart for details).     Reviewed exam and symptoms with patient.  No red flags.  X-ray negative.  Discussed given nature of injury patient may lose the nail but will do finger splint to help protect the nail and for comfort.  Discussed RICE therapy and OTC analgesics as needed.  PCP follow-up 1 week for recheck.  ER precautions reviewed. Final Clinical Impressions(s) / UC Diagnoses   Final diagnoses:  Pain of left thumb     Discharge Instructions      Your x-ray was negative for fracture.  You may use the finger splint for comfort/secure your nail.  You may lose the nail due to the nature of the injury.  May use over-the-counter Tylenol or ibuprofen  as needed.  Please follow-up with your PCP in 1 week for recheck.  Please go to the ER for any worsening symptoms.  Hope you feel better soon!     ED Prescriptions   None    PDMP not reviewed this encounter.   Loreda Myla SAUNDERS, NP 08/15/24 1345

## 2024-08-15 NOTE — ED Triage Notes (Signed)
 Pt states she was roller skating an rammed her left thumb fingernail into concrete wall last night. Pt states the nail is only attached at the finger at the very end towards the finger

## 2024-08-15 NOTE — ED Notes (Addendum)
 Pt stated she is leaving. Pt seen leaving the ED with family

## 2024-09-19 ENCOUNTER — Ambulatory Visit (HOSPITAL_COMMUNITY)
Admission: EM | Admit: 2024-09-19 | Discharge: 2024-09-20 | Disposition: A | Discharge status: Home / Self Care | Attending: Psychiatry | Admitting: Psychiatry

## 2024-09-19 DIAGNOSIS — F23 Brief psychotic disorder: Secondary | ICD-10-CM

## 2024-09-19 DIAGNOSIS — F919 Conduct disorder, unspecified: Secondary | ICD-10-CM | POA: Diagnosis not present

## 2024-09-19 DIAGNOSIS — F69 Unspecified disorder of adult personality and behavior: Secondary | ICD-10-CM

## 2024-09-19 DIAGNOSIS — Z818 Family history of other mental and behavioral disorders: Secondary | ICD-10-CM | POA: Insufficient documentation

## 2024-09-19 DIAGNOSIS — F411 Generalized anxiety disorder: Secondary | ICD-10-CM | POA: Diagnosis present

## 2024-09-19 DIAGNOSIS — R440 Auditory hallucinations: Secondary | ICD-10-CM | POA: Diagnosis not present

## 2024-09-19 DIAGNOSIS — F4312 Post-traumatic stress disorder, chronic: Secondary | ICD-10-CM | POA: Insufficient documentation

## 2024-09-19 DIAGNOSIS — F339 Major depressive disorder, recurrent, unspecified: Secondary | ICD-10-CM

## 2024-09-19 DIAGNOSIS — F333 Major depressive disorder, recurrent, severe with psychotic symptoms: Secondary | ICD-10-CM | POA: Insufficient documentation

## 2024-09-19 DIAGNOSIS — Z79899 Other long term (current) drug therapy: Secondary | ICD-10-CM | POA: Diagnosis not present

## 2024-09-19 LAB — COMPREHENSIVE METABOLIC PANEL WITH GFR
ALT: 14 U/L (ref 0–44)
AST: 15 U/L (ref 15–41)
Albumin: 3.8 g/dL (ref 3.5–5.0)
Alkaline Phosphatase: 70 U/L (ref 38–126)
Anion gap: 10 (ref 5–15)
BUN: 9 mg/dL (ref 6–20)
CO2: 24 mmol/L (ref 22–32)
Calcium: 9.1 mg/dL (ref 8.9–10.3)
Chloride: 103 mmol/L (ref 98–111)
Creatinine, Ser: 0.67 mg/dL (ref 0.44–1.00)
GFR, Estimated: >60 mL/min (ref >60)
Glucose, Bld: 84 mg/dL (ref 70–99)
Potassium: 3.9 mmol/L (ref 3.5–5.1)
Sodium: 137 mmol/L (ref 135–145)
Total Bilirubin: 0.5 mg/dL (ref 0.0–1.2)
Total Protein: 7.7 g/dL (ref 6.5–8.1)

## 2024-09-19 LAB — CBC WITH DIFFERENTIAL/PLATELET
Abs Immature Granulocytes: 0.02 K/uL (ref 0.00–0.07)
Basophils Absolute: 0 K/uL (ref 0.0–0.1)
Basophils Relative: 0 %
Eosinophils Absolute: 0 K/uL (ref 0.0–0.5)
Eosinophils Relative: 1 %
HCT: 39.9 % (ref 36.0–46.0)
Hemoglobin: 12.7 g/dL (ref 12.0–15.0)
Immature Granulocytes: 0 %
Lymphocytes Relative: 40 %
Lymphs Abs: 2.1 K/uL (ref 0.7–4.0)
MCH: 27 pg (ref 26.0–34.0)
MCHC: 31.8 g/dL (ref 30.0–36.0)
MCV: 84.7 fL (ref 80.0–100.0)
Monocytes Absolute: 0.5 K/uL (ref 0.1–1.0)
Monocytes Relative: 10 %
Neutro Abs: 2.6 K/uL (ref 1.7–7.7)
Neutrophils Relative %: 49 %
Platelets: 275 K/uL (ref 150–400)
RBC: 4.71 MIL/uL (ref 3.87–5.11)
RDW: 12.2 % (ref 11.5–15.5)
WBC: 5.3 K/uL (ref 4.0–10.5)
nRBC: 0 % (ref 0.0–0.2)

## 2024-09-19 LAB — POCT URINE DRUG SCREEN - MANUAL ENTRY (I-SCREEN)
POC Amphetamine UR: NOT DETECTED
POC Buprenorphine (BUP): NOT DETECTED
POC Cocaine UR: NOT DETECTED
POC Marijuana UR: POSITIVE — AB
POC Methadone UR: NOT DETECTED
POC Methamphetamine UR: NOT DETECTED
POC Morphine: NOT DETECTED
POC Oxazepam (BZO): NOT DETECTED
POC Oxycodone UR: NOT DETECTED
POC Secobarbital (BAR): NOT DETECTED

## 2024-09-19 LAB — ETHANOL: Alcohol, Ethyl (B): <15 mg/dL (ref <15)

## 2024-09-19 LAB — TSH: TSH: 3.469 u[IU]/mL (ref 0.350–4.500)

## 2024-09-19 LAB — POC URINE PREG, ED: Preg Test, Ur: NEGATIVE

## 2024-09-19 MED ORDER — LORAZEPAM 2 MG/ML IJ SOLN: 2.0000 mg | Freq: Three times a day (TID) | INTRAMUSCULAR | Status: DC | PRN

## 2024-09-19 MED ORDER — DIPHENHYDRAMINE HCL 50 MG/ML IJ SOLN: 50.0000 mg | Freq: Three times a day (TID) | INTRAMUSCULAR | Status: DC | PRN

## 2024-09-19 MED ORDER — ALUM & MAG HYDROXIDE-SIMETH 200-200-20 MG/5ML PO SUSP: 30.0000 mL | ORAL | Status: DC | PRN

## 2024-09-19 MED ORDER — HALOPERIDOL LACTATE 5 MG/ML IJ SOLN: 10.0000 mg | Freq: Three times a day (TID) | INTRAMUSCULAR | Status: DC | PRN

## 2024-09-19 MED ORDER — HALOPERIDOL LACTATE 5 MG/ML IJ SOLN: 5.0000 mg | Freq: Three times a day (TID) | INTRAMUSCULAR | Status: DC | PRN

## 2024-09-19 MED ORDER — MAGNESIUM HYDROXIDE 400 MG/5ML PO SUSP: 30.0000 mL | Freq: Every day | ORAL | Status: DC | PRN

## 2024-09-19 MED ORDER — ACETAMINOPHEN 325 MG PO TABS: 650.0000 mg | ORAL_TABLET | Freq: Four times a day (QID) | ORAL | Status: DC | PRN

## 2024-09-19 MED ORDER — HALOPERIDOL 5 MG PO TABS: 5.0000 mg | ORAL_TABLET | Freq: Three times a day (TID) | ORAL | Status: DC | PRN

## 2024-09-19 MED ORDER — DIPHENHYDRAMINE HCL 50 MG PO CAPS: 50.0000 mg | ORAL_CAPSULE | Freq: Three times a day (TID) | ORAL | Status: DC | PRN
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
## 2024-09-20 DIAGNOSIS — F333 Major depressive disorder, recurrent, severe with psychotic symptoms: Secondary | ICD-10-CM | POA: Diagnosis not present

## 2024-09-20 LAB — HEMOGLOBIN A1C
Hgb A1c MFr Bld: 5.2 % (ref 4.8–5.6)
Mean Plasma Glucose: 102.54 mg/dL

## 2024-09-20 LAB — LIPID PANEL
Cholesterol: 183 mg/dL (ref 0–200)
HDL: 60 mg/dL (ref >40)
LDL Cholesterol: 114 mg/dL — ABNORMAL HIGH (ref 0–99)
Total CHOL/HDL Ratio: 3.1 ratio
Triglycerides: 47 mg/dL (ref <150)
VLDL: 9 mg/dL (ref 0–40)

## 2024-09-20 NOTE — ED Notes (Signed)
 Patient is sitting in bed quietly, patient appears to be in no acute distress, patient denies S/I, H/I, AVH. Breakfast offered in which patient declined, will continue to monitor patient for safety

## 2024-09-20 NOTE — Discharge Instructions (Addendum)

## 2024-09-20 NOTE — ED Notes (Signed)
 Patient blood work has been collected, patient is sitting in bed quietly in no acute distress, will continue to monitor patient for safety.
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
## 2024-09-20 NOTE — ED Notes (Signed)
 Patient is being discharged, after visit summary has been given to patient and patient escorted to waiting room where her ride is awaiting her.

## 2024-11-09 ENCOUNTER — Ambulatory Visit (HOSPITAL_BASED_OUTPATIENT_CLINIC_OR_DEPARTMENT_OTHER): Admitting: Cardiology

## 2024-11-09 NOTE — Progress Notes (Incomplete)
°  Cardiology Office Note:  .   Date:  11/09/2024  ID:  Caroline Castillo, DOB 10/13/2001, MRN [id number] PCP: Colette Torrence GRADE, MD  [facility] Providers Cardiologist:  None {  History of Present Illness: .   Caroline Castillo is a 23 y.o. female with PMH depression with anxiety, ADHD, asthma seen as a new patient evaluation for dizziness and palpitations.  Referral from 07/11/24 reviewed. Seen in urgent care for dizziness and palpitations. Also noted shortness of breath. ECG with abnormal P wave axis (on follow up ECGs this has been normal, question lead placement). Referred to cardiology for further evaluation.  ROS: Denies chest pain, shortness of breath at rest or with normal exertion. No PND, orthopnea, LE edema or unexpected weight gain. No syncope or palpitations. ROS otherwise negative except as noted.   Studies Reviewed: SABRA    EKG:       Physical Exam:   VS:  There were no vitals taken for this visit.   Wt Readings from Last 3 Encounters:  04/04/24 120 lb (54.4 kg)  12/29/23 117 lb 4.8 oz (53.2 kg)  11/12/23 114 lb (51.7 kg)    GEN: Well nourished, well developed in no acute distress HEENT: Normal, moist mucous membranes NECK: No JVD CARDIAC: regular rhythm, normal S1 and S2, no rubs or gallops. No murmur. VASCULAR: Radial and DP pulses 2+ bilaterally. No carotid bruits RESPIRATORY:  Clear to auscultation without rales, wheezing or rhonchi  ABDOMEN: Soft, non-tender, non-distended MUSCULOSKELETAL:  Ambulates independently SKIN: Warm and dry, no edema NEUROLOGIC:  Alert and oriented x 3. No focal neuro deficits noted. PSYCHIATRIC:  Normal affect    ASSESSMENT AND PLAN: .     CV risk counseling and prevention -recommend heart healthy/Mediterranean diet, with whole grains, fruits, vegetable, fish, lean meats, nuts, and olive oil. Limit salt. -recommend moderate walking, 3-5 times/week for 30-50 minutes each session. Aim for at least 150 minutes/week. Goal should be  pace of 3 miles/hours, or walking 1.5 miles in 30 minutes -recommend avoidance of tobacco products. Avoid excess alcohol. -ASCVD risk score: The ASCVD Risk score (Arnett DK, et al., 2019) failed to calculate for the following reasons:   The 2019 ASCVD risk score is only valid for ages 38 to 75    Dispo: ***  Signed, Shelda Bruckner, MD   Shelda Bruckner, MD, PhD, [facility]  [facility]  [place]  [facility] at [facility] at [facility] [address] [place], [place] [address] [phone]
